# Patient Record
Sex: Female | Born: 1955 | Race: White | Hispanic: No | Marital: Married | Smoking: Former smoker
Health system: Southern US, Community
[De-identification: ages and names within clinical notes are randomized; demographics above are authoritative.]

## PROBLEM LIST (undated history)

## (undated) DIAGNOSIS — B009 Herpesviral infection, unspecified: Secondary | ICD-10-CM

## (undated) DIAGNOSIS — T7840XA Allergy, unspecified, initial encounter: Secondary | ICD-10-CM

## (undated) DIAGNOSIS — M1990 Unspecified osteoarthritis, unspecified site: Secondary | ICD-10-CM

## (undated) DIAGNOSIS — C801 Malignant (primary) neoplasm, unspecified: Secondary | ICD-10-CM

## (undated) DIAGNOSIS — R7301 Impaired fasting glucose: Secondary | ICD-10-CM

## (undated) DIAGNOSIS — A048 Other specified bacterial intestinal infections: Secondary | ICD-10-CM

## (undated) DIAGNOSIS — E785 Hyperlipidemia, unspecified: Secondary | ICD-10-CM

## (undated) HISTORY — PX: CATARACT EXTRACTION, BILATERAL: SHX1313

## (undated) HISTORY — DX: Herpesviral infection, unspecified: B00.9

## (undated) HISTORY — DX: Allergy, unspecified, initial encounter: T78.40XA

## (undated) HISTORY — PX: TONSILLECTOMY: SUR1361

## (undated) HISTORY — PX: VAGINAL HYSTERECTOMY: SUR661

## (undated) HISTORY — DX: Unspecified osteoarthritis, unspecified site: M19.90

## (undated) HISTORY — PX: UPPER GASTROINTESTINAL ENDOSCOPY: SHX188

## (undated) HISTORY — DX: Other specified bacterial intestinal infections: A04.8

## (undated) HISTORY — DX: Impaired fasting glucose: R73.01

## (undated) HISTORY — PX: RETINAL DETACHMENT SURGERY: SHX105

## (undated) HISTORY — PX: WISDOM TOOTH EXTRACTION: SHX21

## (undated) HISTORY — DX: Hyperlipidemia, unspecified: E78.5

## (undated) HISTORY — DX: Malignant (primary) neoplasm, unspecified: C80.1

---

## 1979-09-01 DIAGNOSIS — N879 Dysplasia of cervix uteri, unspecified: Secondary | ICD-10-CM

## 1979-09-01 HISTORY — DX: Dysplasia of cervix uteri, unspecified: N87.9

## 2005-08-31 DIAGNOSIS — H269 Unspecified cataract: Secondary | ICD-10-CM

## 2005-08-31 HISTORY — DX: Unspecified cataract: H26.9

## 2006-05-06 ENCOUNTER — Ambulatory Visit (HOSPITAL_COMMUNITY): Admission: RE | Admit: 2006-05-06 | Discharge: 2006-05-07 | Payer: Self-pay | Admitting: Ophthalmology

## 2017-07-21 HISTORY — PX: COLONOSCOPY: SHX174

## 2017-08-31 DIAGNOSIS — H3320 Serous retinal detachment, unspecified eye: Secondary | ICD-10-CM

## 2017-08-31 HISTORY — DX: Serous retinal detachment, unspecified eye: H33.20

## 2019-01-30 DIAGNOSIS — Z5189 Encounter for other specified aftercare: Secondary | ICD-10-CM

## 2019-01-30 DIAGNOSIS — D649 Anemia, unspecified: Secondary | ICD-10-CM

## 2019-01-30 HISTORY — DX: Encounter for other specified aftercare: Z51.89

## 2019-01-30 HISTORY — DX: Anemia, unspecified: D64.9

## 2019-03-01 DIAGNOSIS — H409 Unspecified glaucoma: Secondary | ICD-10-CM

## 2019-03-01 HISTORY — DX: Unspecified glaucoma: H40.9

## 2019-03-14 ENCOUNTER — Encounter: Payer: Self-pay | Admitting: Gastroenterology

## 2019-03-17 ENCOUNTER — Telehealth (INDEPENDENT_AMBULATORY_CARE_PROVIDER_SITE_OTHER): Admitting: Gastroenterology

## 2019-03-17 ENCOUNTER — Other Ambulatory Visit: Payer: Self-pay

## 2019-03-17 ENCOUNTER — Encounter: Payer: Self-pay | Admitting: Gastroenterology

## 2019-03-17 VITALS — Ht 60.0 in | Wt 160.0 lb

## 2019-03-17 DIAGNOSIS — D509 Iron deficiency anemia, unspecified: Secondary | ICD-10-CM

## 2019-03-17 DIAGNOSIS — R195 Other fecal abnormalities: Secondary | ICD-10-CM

## 2019-03-17 MED ORDER — CLENPIQ 10-3.5-12 MG-GM -GM/160ML PO SOLN: 1.0000 | Freq: Once | ORAL | 0 refills | Status: AC

## 2019-03-17 NOTE — Progress Notes (Signed)
Chief Complaint: Severe anemia.  Referring Provider:  Charlynn Court, NP      ASSESSMENT AND PLAN;   #1. IDA with heme positive stools(Hb 4.7 s/p 2U to 8, 12/06/2018).  #2. Slow GI bleed.  Plan: -Records from Lucita Lora FNP [facility] Glassman to get) -CT Abdo/pelvis with p.o. and IV contrast. -EGD with SB bx and colonoscopy for further evaluation.  Have discussed risks and benefits including small but definite risk of perforation requiring laparotomy, bleeding, rarely missing neoplasms.  If negative, may need capsule endoscopy. -Trend CBC.  She had blood work drawn this morning. -D/w patient and patient's husband Mikki Santee   HPI:    Diane Figueroa is a 63 y.o. female  Was feeling weak tired and looking pale since Memorial Day per husband.  She will also get short of breath especially while biking.  No craving of ice.  She was craving tortillas. Did have palpitations.  Seen by Lucita Lora.  Found to have hemoglobin of 4.7.  Sent to the hospital for emergent blood transfusion.  Do not have all records.  She was also found to have heme positive stools.   No NSAIDs.  No nausea, vomiting, heartburn, regurgitation, odynophagia or dysphagia.  No significant diarrhea or constipation.  There is no melena or hematochezia. No unintentional weight loss.  No abdo pain.  No hematuria, nosebleeds, or any vaginal bleeding.  Of note that she is status post vaginal hysterectomy in the past.  Colon (07/2017 [facility])-6 mm polyp status post polypectomy, minimal sigmoid diverticulosis. Bx- TA Past Medical History:  Diagnosis Date  . H. pylori infection   . Herpes simplex   . Hyperlipidemia   . IFG (impaired fasting glucose)     Past Surgical History:  Procedure Laterality Date  . CATARACT EXTRACTION, BILATERAL    . COLONOSCOPY  07/21/2017   Colonic polyp status post polypectomy. Very minimal sigmoid diverticulosis.   Marland Kitchen VAGINAL HYSTERECTOMY    . WISDOM TOOTH EXTRACTION      Family History  Problem  Relation Age of Onset  . Lung cancer Father   . Lung cancer Paternal Grandfather   . Colon cancer Neg Hx     Social History   Tobacco Use  . Smoking status: Never Smoker  . Smokeless tobacco: Never Used  Substance Use Topics  . Alcohol use: Never    Frequency: Never  . Drug use: Not on file    Current Outpatient Medications  Medication Sig Dispense Refill  . atorvastatin (LIPITOR) 20 MG tablet daily.    . ferrous sulfate 325 (65 FE) MG EC tablet Take 325 mg by mouth 3 (three) times daily with meals.    . valACYclovir (VALTREX) 500 MG tablet Take 500 mg by mouth daily.     No current facility-administered medications for this visit.     Allergies  Allergen Reactions  . Advair Diskus [Fluticasone-Salmeterol]     Review of Systems:  Constitutional: Denies fever, chills, diaphoresis, appetite change and had extreme fatigue.  HEENT: Denies photophobia, eye pain, redness, hearing loss, ear pain, congestion, sore throat, rhinorrhea, sneezing, mouth sores, neck pain, neck stiffness and tinnitus.   Respiratory: Denies SOB, DOE, cough, chest tightness,  and wheezing.  Feels better now after transfusion. Cardiovascular: Denies chest pain, palpitations and leg swelling.  Genitourinary: Denies dysuria, urgency, frequency, hematuria, flank pain and difficulty urinating.  Musculoskeletal: Denies myalgias, back pain, joint swelling, arthralgias and gait problem.  Skin: No rash.  Neurological: Denies dizziness, seizures, syncope, weakness, light-headedness,  numbness and headaches.  Hematological: Denies adenopathy. Easy bruising, personal or family bleeding history  Psychiatric/Behavioral: No anxiety or depression     Physical Exam:    Ht 5' (1.524 m)   Wt 160 lb (72.6 kg)   BMI 31.25 kg/m  Filed Weights   03/17/19 1154  Weight: 160 lb (72.6 kg)  Tele-visit.   This service was provided via telemedicine.  The patient was located at home.  The provider was located in office.   The patient did consent to this telephone visit and is aware of possible charges through their insurance for this visit.  The patient was referred by Lucita Lora, FNP.  The other persons participating in this telemedicine service were patient's husband Mikki Santee and their role was coordination of care.  Time spent on call/coordination of care: 30 min  Carmell Austria, MD 03/17/2019, 2:06 PM  Cc: Charlynn Court, NP

## 2019-03-17 NOTE — Patient Instructions (Signed)
If you are age 64 or older, your body mass index should be between 23-30. Your Body mass index is 31.25 kg/m. If this is out of the aforementioned range listed, please consider follow up with your Primary Care Provider.  If you are age 3 or younger, your body mass index should be between 19-25. Your Body mass index is 31.25 kg/m. If this is out of the aformentioned range listed, please consider follow up with your Primary Care Provider.   You have been scheduled for an endoscopy and colonoscopy. Please follow the written instructions given to you at your visit today. Please pick up your prep supplies at the pharmacy within the next 1-3 days. If you use inhalers (even only as needed), please bring them with you on the day of your procedure. Your physician has requested that you go to www.startemmi.com and enter the access code given to you at your visit today. This web site gives a general overview about your procedure. However, you should still follow specific instructions given to you by our office regarding your preparation for the procedure.  I have attached a Pre Procedure Patient Acknowledgement form and a prepaid envelope, please initial and sign form and mail back in envelope.   You have been scheduled [facility] CT [facility] [place] [address] [address]).   You are scheduled on 03/23/19 at [place] should arrive 15 minutes prior to your appointment time for registration. Please follow the written instructions below on the day of your exam:  WARNING: IF YOU ARE ALLERGIC TO IODINE/X-RAY DYE, PLEASE NOTIFY RADIOLOGY IMMEDIATELY AT [phone]! YOU WILL BE GIVEN A 13 HOUR PREMEDICATION PREP.  1) Do not eat or drink anything after 5am (4 hours prior to your test) 2) You have been given 2 bottles of oral contrast to drink. The solution may taste better if refrigerated, but do NOT add ice or any other liquid to this  solution. Shake well before drinking.    Drink 1 bottle of contrast @ 7am (2 hours prior to your exam)  Drink 1 bottle of contrast @ 8am (1 hour prior to your exam)  You may take any medications as prescribed with a small amount of water, if necessary. If you take any of the following medications: METFORMIN, GLUCOPHAGE, GLUCOVANCE, AVANDAMET, RIOMET, FORTAMET, [place] MET, JANUMET, GLUMETZA or METAGLIP, you MAY be asked to HOLD this medication 48 hours AFTER the exam.  The purpose of you drinking the oral contrast is to aid in the visualization of your intestinal tract. The contrast solution may cause some diarrhea. Depending on your individual set of symptoms, you may also receive an intravenous injection of x-ray contrast/dye. Plan on being at [facility] for 30 minutes or longer, depending on the type of exam you are having performed.  This test typically takes 30-45 minutes to complete.  If you have any questions regarding your exam or if you need to reschedule, you may call the CT department at [phone] between the hours of 8:00 am and 5:00 pm, Monday-Friday.  ________________________________________________________________________  Thank you,  Dr. Jackquline Denmark

## 2019-03-20 ENCOUNTER — Telehealth: Payer: Self-pay | Admitting: Gastroenterology

## 2019-03-20 DIAGNOSIS — K259 Gastric ulcer, unspecified as acute or chronic, without hemorrhage or perforation: Secondary | ICD-10-CM

## 2019-03-20 HISTORY — DX: Gastric ulcer, unspecified as acute or chronic, without hemorrhage or perforation: K25.9

## 2019-03-20 NOTE — Telephone Encounter (Signed)

## 2019-03-21 ENCOUNTER — Other Ambulatory Visit: Payer: Self-pay

## 2019-03-21 ENCOUNTER — Ambulatory Visit (AMBULATORY_SURGERY_CENTER): Admitting: Gastroenterology

## 2019-03-21 ENCOUNTER — Encounter: Payer: Self-pay | Admitting: Gastroenterology

## 2019-03-21 VITALS — BP 121/61 | HR 66 | Temp 98.0°F | Resp 24 | Ht 60.0 in | Wt 160.0 lb

## 2019-03-21 DIAGNOSIS — K2970 Gastritis, unspecified, without bleeding: Secondary | ICD-10-CM | POA: Diagnosis not present

## 2019-03-21 DIAGNOSIS — K5520 Angiodysplasia of colon without hemorrhage: Secondary | ICD-10-CM

## 2019-03-21 DIAGNOSIS — K2990 Gastroduodenitis, unspecified, without bleeding: Secondary | ICD-10-CM | POA: Diagnosis not present

## 2019-03-21 DIAGNOSIS — R195 Other fecal abnormalities: Secondary | ICD-10-CM

## 2019-03-21 DIAGNOSIS — K649 Unspecified hemorrhoids: Secondary | ICD-10-CM | POA: Diagnosis not present

## 2019-03-21 DIAGNOSIS — K259 Gastric ulcer, unspecified as acute or chronic, without hemorrhage or perforation: Secondary | ICD-10-CM

## 2019-03-21 DIAGNOSIS — D508 Other iron deficiency anemias: Secondary | ICD-10-CM

## 2019-03-21 DIAGNOSIS — K5730 Diverticulosis of large intestine without perforation or abscess without bleeding: Secondary | ICD-10-CM | POA: Diagnosis not present

## 2019-03-21 DIAGNOSIS — K3189 Other diseases of stomach and duodenum: Secondary | ICD-10-CM

## 2019-03-21 MED ORDER — SODIUM CHLORIDE 0.9 % IV SOLN: 500.0000 mL | Freq: Once | INTRAVENOUS | Status: DC

## 2019-03-21 MED ORDER — PANTOPRAZOLE SODIUM 40 MG PO TBEC: 40.0000 mg | DELAYED_RELEASE_TABLET | Freq: Every day | ORAL | 6 refills | Status: AC

## 2019-03-21 NOTE — Op Note (Signed)
[place] Patient Name: Diane Figueroa Name Procedure Date: 03/21/2019 1:24 PM MRN: [id number] Endoscopist: Jackquline Denmark , MD Age: 63 Referring MD:  Date of Birth: March 04, 1956 Gender: Female Account #: [id number] Procedure:                Colonoscopy Indications:              IDA with heme positive stools(Hb 4.7 s/p 2U to 8,                            12/06/2018). Medicines:                Monitored Anesthesia Care Procedure:                Pre-Anesthesia Assessment:                           - Prior to the procedure, a History and Physical                            was performed, and patient medications and                            allergies were reviewed. The patient's tolerance of                            previous anesthesia was also reviewed. The risks                            and benefits of the procedure and the sedation                            options and risks were discussed with the patient.                            All questions were answered, and informed consent                            was obtained. Prior Anticoagulants: The patient has                            taken no previous anticoagulant or antiplatelet                            agents. ASA Grade Assessment: II - A patient with                            mild systemic disease. After reviewing the risks                            and benefits, the patient was deemed in                            satisfactory condition to undergo the procedure.  After obtaining informed consent, the colonoscope                            was passed under direct vision. Throughout the                            procedure, the patient's blood pressure, pulse, and                            oxygen saturations were monitored continuously. The                            Colonoscope was introduced through the anus and                            advanced to the 4 cm into the ileum. The                     colonoscopy was performed without difficulty. The                            patient tolerated the procedure well. The quality                            of the bowel preparation was excellent. The                            terminal ileum, ileocecal valve, appendiceal                            orifice, and rectum were photographed. Scope In: 1:46:25 PM Scope Out: 2:05:40 PM Scope Withdrawal Time: 0 hours 12 minutes 36 seconds  Total Procedure Duration: 0 hours 19 minutes 15 seconds  Findings:                 Two angiodysplastic lesions (18mm and 1 cm) without                            bleeding were found in the ascending colon and in                            the cecum. Estimated blood loss: none.                           A few rare small-mouthed diverticula were found in                            the sigmoid colon.                           Non-bleeding internal hemorrhoids were found during                            retroflexion. The hemorrhoids were moderate.  The terminal ileum appeared normal.                           The exam was otherwise without abnormality. Complications:            No immediate complications. Estimated Blood Loss:     Estimated blood loss: none. Impression:               - Incidental 2 non-bleeding colonic AVMs.                           - Minimal sigmoid diverticulosis.                           - Non-bleeding internal hemorrhoids.                           - Otherwise normal colonoscopy to TI. Recommendation:           - Patient has a contact number available for                            emergencies. The signs and symptoms of potential                            delayed complications were discussed with the                            patient. Return to normal activities tomorrow.                            Written discharge instructions were provided to the                            patient.                            - Resume previous diet.                           - Continue present medications.                           - No ibuprofen, naproxen, or other non-steroidal                            anti-inflammatory drugs.                           - Return to GI clinic in 4 weeks.                           - She is scheduled to have CT Abdo/pelvis this week.                           - Please see EGD note for further plan.                           -  In the future, if she continues to have anemia                            with heme positive stools and the work-up is                            negative, would consider APC treatment (at [facility]) of                            above AVMs. Jackquline Denmark, MD 03/21/2019 2:26:29 PM This report has been signed electronically.

## 2019-03-21 NOTE — Progress Notes (Signed)
Pt's states no medical or surgical changes since previsit or office visit. 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

## 2019-03-21 NOTE — Progress Notes (Signed)
PT taken to PACU. Monitors in place. VSS. Report given to RN. 

## 2019-03-21 NOTE — Progress Notes (Signed)
Called to room to assist during endoscopic procedure.  Patient ID and intended procedure confirmed with present staff. Received instructions for my participation in the procedure from the performing physician.  

## 2019-03-21 NOTE — Patient Instructions (Signed)
Please read handouts provided. Continue present medications. Return to GI clinic in 4 weeks. No ibuprofen, naproxen, or other non-steriodal anti-inflammatory drugs. Begin Protonix 40 mg once a day. Await pathology results. CBC in 2 weeks. Repeat EGD in 12 weeks.      YOU HAD AN ENDOSCOPIC PROCEDURE TODAY AT [facility]:   Refer to the procedure report that was given to you for any specific questions about what was found during the examination.  If the procedure report does not answer your questions, please call your gastroenterologist to clarify.  If you requested that your care partner not be given the details of your procedure findings, then the procedure report has been included in a sealed envelope for you to review at your convenience later.  YOU SHOULD EXPECT: Some feelings of bloating in the abdomen. Passage of more gas than usual.  Walking can help get rid of the air that was put into your GI tract during the procedure and reduce the bloating. If you had a lower endoscopy (such as a colonoscopy or flexible sigmoidoscopy) you may notice spotting of blood in your stool or on the toilet paper. If you underwent a bowel prep for your procedure, you may not have a normal bowel movement for a few days.  Please Note:  You might notice some irritation and congestion in your nose or some drainage.  This is from the oxygen used during your procedure.  There is no need for concern and it should clear up in a day or so.  SYMPTOMS TO REPORT IMMEDIATELY:   Following lower endoscopy (colonoscopy or flexible sigmoidoscopy):  Excessive amounts of blood in the stool  Significant tenderness or worsening of abdominal pains  Swelling of the abdomen that is new, acute  Fever of 100F or higher   Following upper endoscopy (EGD)  Vomiting of blood or coffee ground material  New chest pain or pain under the shoulder blades  Painful or persistently difficult swallowing  New shortness  of breath  Fever of 100F or higher  Black, tarry-looking stools  For urgent or emergent issues, a gastroenterologist can be reached at any hour by calling [phone].   DIET:  We do recommend a small meal at first, but then you may proceed to your regular diet.  Drink plenty of fluids but you should avoid alcoholic beverages for 24 hours.  ACTIVITY:  You should plan to take it easy for the rest of today and you should NOT DRIVE or use heavy machinery until tomorrow (because of the sedation medicines used during the test).    FOLLOW UP: Our staff will call the number listed on your records 48-72 hours following your procedure to check on you and address any questions or concerns that you may have regarding the information given to you following your procedure. If we do not reach you, we will leave a message.  We will attempt to reach you two times.  During this call, we will ask if you have developed any symptoms of COVID 19. If you develop any symptoms (ie: fever, flu-like symptoms, shortness of breath, cough etc.) before then, please call [phone].  If you test positive for Covid 19 in the 2 weeks post procedure, please call and report this information to [place].    If any biopsies were taken you will be contacted by phone or by letter within the next 1-3 weeks.  Please call us at [phone] if you have not heard about the biopsies  in 3 weeks.    SIGNATURES/CONFIDENTIALITY: You and/or your care partner have signed paperwork which will be entered into your electronic medical record.  These signatures attest to the fact that that the information above on your After Visit Summary has been reviewed and is understood.  Full responsibility of the confidentiality of this discharge information lies with you and/or your care-partner.

## 2019-03-21 NOTE — Op Note (Signed)
[place] Patient Name: Lyndon Chenoweth Procedure Date: 03/21/2019 1:25 PM MRN: [id number] Endoscopist: Jackquline Denmark , MD Age: 63 Referring MD:  Date of Birth: 1956/08/17 Gender: Female Account #: [id number] Procedure:                Upper GI endoscopy Indications:              IDA with heme positive stools(Hb 4.7 s/p 2U to 8,                            12/06/2018). Medicines:                Monitored Anesthesia Care Procedure:                Pre-Anesthesia Assessment:                           - Prior to the procedure, a History and Physical                            was performed, and patient medications and                            allergies were reviewed. The patient's tolerance of                            previous anesthesia was also reviewed. The risks                            and benefits of the procedure and the sedation                            options and risks were discussed with the patient.                            All questions were answered, and informed consent                            was obtained. Prior Anticoagulants: The patient has                            taken no previous anticoagulant or antiplatelet                            agents. ASA Grade Assessment: II - A patient with                            mild systemic disease. After reviewing the risks                            and benefits, the patient was deemed in                            satisfactory condition to undergo the procedure.  After obtaining informed consent, the endoscope was                            passed under direct vision. Throughout the                            procedure, the patient's blood pressure, pulse, and                            oxygen saturations were monitored continuously. The                            Endoscope was introduced through the mouth, and                            advanced to the second part of duodenum. The  upper                            GI endoscopy was accomplished without difficulty.                            The patient tolerated the procedure well. Scope In: Scope Out: Findings:                 The examined esophagus was normal.                           The Z-line was irregular and was found 35 cm from                            the incisors. Biopsies were taken with a cold                            forceps for histology. Estimated blood loss: none.                           3 non-bleeding cratered gastric ulcers measuring 8                            mm, 8 mm and 1 cm with no stigmata of bleeding were                            found in the gastric body along the greater                            curvature (2) and in the antrum (1). Multiple                            biopsies were taken with a cold forceps for                            histology. Estimated blood loss: none. Mild  erythema in the antrum and in the first portion of                            the duodenum.                           The examined duodenum was normal. Biopsies for                            histology were taken with a cold forceps for                            evaluation of celiac disease. Complications:            No immediate complications. Estimated Blood Loss:     Estimated blood loss: none. Impression:               -Slightly irregular Z line (biopsied)                           -Gastric ulcers (biopsied)                           -Mild gastroduodenitis. Recommendation:           - Patient has a contact number available for                            emergencies. The signs and symptoms of potential                            delayed complications were discussed with the                            patient. Return to normal activities tomorrow.                            Written discharge instructions were provided to the                            patient.                            - Resume previous diet.                           -Start Protonix 40 mg p.o. once a day. # 30 with 6                            refills                           - Await pathology results.                           - No aspirin, ibuprofen, naproxen, or other  non-steroidal anti-inflammatory drugs.                           - Recheck CBC in 2 weeks (pl arrange)                           - Return to GI clinic in 4 weeks.                           - Repeat EGD in 12 weeks to ensure healing. Jackquline Denmark, MD 03/21/2019 2:16:33 PM This report has been signed electronically.

## 2019-03-22 ENCOUNTER — Other Ambulatory Visit: Payer: Self-pay

## 2019-03-22 DIAGNOSIS — R197 Diarrhea, unspecified: Secondary | ICD-10-CM

## 2019-03-22 DIAGNOSIS — D509 Iron deficiency anemia, unspecified: Secondary | ICD-10-CM

## 2019-03-23 ENCOUNTER — Encounter (HOSPITAL_BASED_OUTPATIENT_CLINIC_OR_DEPARTMENT_OTHER): Payer: Self-pay

## 2019-03-23 ENCOUNTER — Ambulatory Visit (HOSPITAL_BASED_OUTPATIENT_CLINIC_OR_DEPARTMENT_OTHER)
Admission: RE | Admit: 2019-03-23 | Discharge: 2019-03-23 | Disposition: A | Discharge status: Home / Self Care | Source: Ambulatory Visit | Attending: Gastroenterology | Admitting: Gastroenterology

## 2019-03-23 ENCOUNTER — Telehealth: Payer: Self-pay

## 2019-03-23 ENCOUNTER — Other Ambulatory Visit: Payer: Self-pay

## 2019-03-23 DIAGNOSIS — R195 Other fecal abnormalities: Secondary | ICD-10-CM | POA: Diagnosis present

## 2019-03-23 DIAGNOSIS — D509 Iron deficiency anemia, unspecified: Secondary | ICD-10-CM | POA: Insufficient documentation

## 2019-03-23 MED ORDER — IOHEXOL 300 MG/ML  SOLN
100.0000 mL | Freq: Once | INTRAMUSCULAR | Status: AC | PRN
  Administered 2019-03-23: 100 mL via INTRAVENOUS

## 2019-03-23 NOTE — Telephone Encounter (Signed)
  Follow up Call-  Call back number 03/21/2019  Post procedure Call Back phone  # [phone]  Permission to leave phone message Yes  Some recent data might be hidden     Patient questions:  Do you have a fever, pain , or abdominal swelling? No. Pain Score  0 *  Have you tolerated food without any problems? Yes.    Have you been able to return to your normal activities? Yes.    Do you have any questions about your discharge instructions: Diet   No. Medications  No. Follow up visit  No.  Do you have questions or concerns about your Care? No.  Actions: * If pain score is 4 or above: No action needed, pain <4.  1. Have you developed a fever since your procedure? no  2.   Have you had an respiratory symptoms (SOB or cough) since your procedure? no  3.   Have you tested positive for COVID 19 since your procedure no  4.   Have you had any family members/close contacts diagnosed with the COVID 19 since your procedure?  no   If yes to any of these questions please route to Joylene John, RN and Alphonsa Gin, Therapist, sports.

## 2019-03-25 ENCOUNTER — Encounter: Payer: Self-pay | Admitting: Gastroenterology

## 2019-04-20 ENCOUNTER — Telehealth (INDEPENDENT_AMBULATORY_CARE_PROVIDER_SITE_OTHER): Admitting: Gastroenterology

## 2019-04-20 ENCOUNTER — Other Ambulatory Visit: Payer: Self-pay

## 2019-04-20 DIAGNOSIS — D509 Iron deficiency anemia, unspecified: Secondary | ICD-10-CM

## 2019-04-20 NOTE — Progress Notes (Signed)
Chief Complaint:FU  Referring Provider:  Charlynn Court, NP      ASSESSMENT AND PLAN;   #1. IDA  (resolved) (Hb 4.7 s/p 2U to 8, 12/06/2018, Hb 04/04/2019 12.8, MCV 93).  #2.  Likely UGI bleed d/t GU. Neg HP, neg SB Bx for celiac.  Neg CT AP 03/2019 and colon 03/2019 except for nonbleeding AVMs and diverticulosis.  #3. GERD  Plan: -Repeat EGD in 12 weeks (October end or November 2020) to ensure healing of the ulcers. -Recheck CBC September end. -Continue iron supplements for now. -Continue Protonix 40 mg p.o. once a day for now. -D/w patient and patient's husband Mikki Santee   HPI:    Diane Figueroa is a 63 y.o. female  For follow-up visit. Doing much better. Had EGD and colonoscopy 03/20/2019 which showed gastric ulcers.  Biopsies were negative for H. pylori.  Colonoscopy showed nonbleeding AVMs and colonic diverticulosis.  CT was negative for any acute abnormalities or any bleeding.  She feels much better.  Repeat hemoglobin on 04/04/2019 was 12.8, MCV 93.  Tolerating iron well.  Had some constipation.  Drinks plenty of water.  No hematuria, nosebleeds, or any vaginal bleeding.  Of note that she is status post vaginal hysterectomy in the past.  Past Medical History:  Diagnosis Date  . H. pylori infection   . Herpes simplex   . Hyperlipidemia   . IFG (impaired fasting glucose)     Past Surgical History:  Procedure Laterality Date  . CATARACT EXTRACTION, BILATERAL    . COLONOSCOPY  07/21/2017   Colonic polyp status post polypectomy. Very minimal sigmoid diverticulosis.   Marland Kitchen VAGINAL HYSTERECTOMY    . WISDOM TOOTH EXTRACTION      Family History  Problem Relation Age of Onset  . Lung cancer Father   . Lung cancer Paternal Grandfather   . Colon cancer Neg Hx     Social History   Tobacco Use  . Smoking status: Former Research scientist (life sciences)  . Smokeless tobacco: Never Used  . Tobacco comment: quit smoking in 2007  Substance Use Topics  . Alcohol use: Not Currently    Frequency:  Never  . Drug use: Never    Current Outpatient Medications  Medication Sig Dispense Refill  . atorvastatin (LIPITOR) 20 MG tablet daily.    . brimonidine (ALPHAGAN) 0.15 % ophthalmic solution INSTILL 1 DROP INTO LEFT EYE TWICE DAILY    . dorzolamide-timolol (COSOPT) 22.3-6.8 MG/ML ophthalmic solution INSTILL 1 DROP INTO LEFT EYE TWICE DAILY    . ferrous sulfate 325 (65 FE) MG EC tablet Take 325 mg by mouth 2 (two) times daily.     Marland Kitchen latanoprost (XALATAN) 0.005 % ophthalmic solution INSTILL 1 DROP INTO LEFT EYE IN THE EVENING    . pantoprazole (PROTONIX) 40 MG tablet Take 1 tablet (40 mg total) by mouth daily. 30 tablet 6  . valACYclovir (VALTREX) 500 MG tablet Take 500 mg by mouth daily.     No current facility-administered medications for this visit.     Allergies  Allergen Reactions  . Advair Diskus [Fluticasone-Salmeterol]     Review of Systems:  neg     Physical Exam:    There were no vitals taken for this visit. There were no vitals filed for this visit.Tele-visit.   This service was provided via telemedicine.  Video visit failed.  The patient was located at home.  The provider was located in office.  The patient did consent to this telephone visit and is aware of  possible charges through their insurance for this visit.  The patient was referred by Lucita Lora, FNP.  The other persons participating in this telemedicine service were patient's husband Mikki Santee and their role was coordination of care.  Time spent on call/coordination of care: 15 min  Carmell Austria, MD 04/20/2019, 11:12 AM  Cc: Charlynn Court, NP

## 2019-04-20 NOTE — Patient Instructions (Signed)
If you are age 63 or older, your body mass index should be between 23-30. Your There is no height or weight on file to calculate BMI. If this is out of the aforementioned range listed, please consider follow up with your Primary Care Provider.  If you are age 67 or younger, your body mass index should be between 19-25. Your There is no height or weight on file to calculate BMI. If this is out of the aformentioned range listed, please consider follow up with your Primary Care Provider.   You will be due for a recall EGD in 07/2019. We will send you a reminder in the mail when it gets closer to that time.  Please go to the lab at [facility] ([place] You will need to go to level "B", you do not need an appointment for this. Hours available are 7:30 am - 4:30 pm. Dr. Lyndel Safe would like for you to do this at the end of September.   Continue iron and protonix.   Thank you,  Dr. Jackquline Denmark

## 2019-06-05 ENCOUNTER — Other Ambulatory Visit (INDEPENDENT_AMBULATORY_CARE_PROVIDER_SITE_OTHER)

## 2019-06-05 DIAGNOSIS — D509 Iron deficiency anemia, unspecified: Secondary | ICD-10-CM

## 2019-06-05 LAB — CBC WITH DIFFERENTIAL/PLATELET
Basophils Absolute: 0.1 10*3/uL (ref 0.0–0.1)
Basophils Relative: 1 % (ref 0.0–3.0)
Eosinophils Absolute: 0.2 10*3/uL (ref 0.0–0.7)
Eosinophils Relative: 3 % (ref 0.0–5.0)
HCT: 44.6 % (ref 36.0–46.0)
Hemoglobin: 14.7 g/dL (ref 12.0–15.0)
Lymphocytes Relative: 30 % (ref 12.0–46.0)
Lymphs Abs: 1.9 10*3/uL (ref 0.7–4.0)
MCHC: 32.9 g/dL (ref 30.0–36.0)
MCV: 94.9 fl (ref 78.0–100.0)
Monocytes Absolute: 0.6 10*3/uL (ref 0.1–1.0)
Monocytes Relative: 10.3 % (ref 3.0–12.0)
Neutro Abs: 3.5 10*3/uL (ref 1.4–7.7)
Neutrophils Relative %: 55.7 % (ref 43.0–77.0)
Platelets: 208 10*3/uL (ref 150.0–400.0)
RBC: 4.7 Mil/uL (ref 3.87–5.11)
RDW: 12.5 % (ref 11.5–15.5)
WBC: 6.3 10*3/uL (ref 4.0–10.5)

## 2019-08-11 ENCOUNTER — Encounter: Payer: Self-pay | Admitting: Gastroenterology

## 2019-08-14 DIAGNOSIS — U071 COVID-19: Secondary | ICD-10-CM

## 2019-08-14 HISTORY — DX: COVID-19: U07.1

## 2019-08-22 ENCOUNTER — Encounter: Payer: Self-pay | Admitting: Gastroenterology

## 2019-09-11 ENCOUNTER — Ambulatory Visit (AMBULATORY_SURGERY_CENTER): Payer: Self-pay | Admitting: *Deleted

## 2019-09-11 ENCOUNTER — Other Ambulatory Visit: Payer: Self-pay

## 2019-09-11 VITALS — Temp 96.9°F | Ht 60.0 in | Wt 162.0 lb

## 2019-09-11 DIAGNOSIS — K259 Gastric ulcer, unspecified as acute or chronic, without hemorrhage or perforation: Secondary | ICD-10-CM

## 2019-09-11 NOTE — Progress Notes (Signed)
No egg or soy allergy known to patient  No issues with past sedation with any surgeries  or procedures, no intubation problems  No diet pills per patient No home 02 use per patient  No blood thinners per patient  Pt denies issues with constipation  No A fib or A flutter  EMMI video sent to pt's e mail   08-14-2019 pt had COVID 19- she currently has no s/s- no fever- she will not require pre screen testing prior to EGD 09-26-2019  Due to the COVID-19 pandemic we are asking patients to follow these guidelines. Please only bring one care partner. Please be aware that your care partner may wait in the car in the parking lot or if they feel like they will be too hot to wait in the car, they may wait in the lobby on the 4th floor. All care partners are required to wear a mask the entire time (we do not have any that we can provide them), they need to practice social distancing, and we will do a Covid check for all patient's and care partners when you arrive. Also we will check their temperature and your temperature. If the care partner waits in their car they need to stay in the parking lot the entire time and we will call them on their cell phone when the patient is ready for discharge so they can bring the car to the front of the building. Also all patient's will need to wear a mask into building.

## 2019-09-26 ENCOUNTER — Other Ambulatory Visit: Payer: Self-pay

## 2019-09-26 ENCOUNTER — Ambulatory Visit (AMBULATORY_SURGERY_CENTER): Admitting: Gastroenterology

## 2019-09-26 ENCOUNTER — Encounter: Payer: Self-pay | Admitting: Gastroenterology

## 2019-09-26 VITALS — BP 114/60 | HR 58 | Temp 95.9°F | Resp 14 | Ht 60.0 in | Wt 162.0 lb

## 2019-09-26 DIAGNOSIS — K2950 Unspecified chronic gastritis without bleeding: Secondary | ICD-10-CM

## 2019-09-26 DIAGNOSIS — K259 Gastric ulcer, unspecified as acute or chronic, without hemorrhage or perforation: Secondary | ICD-10-CM | POA: Diagnosis present

## 2019-09-26 MED ORDER — SODIUM CHLORIDE 0.9 % IV SOLN: 500.0000 mL | Freq: Once | INTRAVENOUS | Status: AC

## 2019-09-26 NOTE — Progress Notes (Signed)
Report to PACU, RN, vss, BBS= Clear.  

## 2019-09-26 NOTE — Patient Instructions (Signed)
YOU HAD AN ENDOSCOPIC PROCEDURE TODAY AT [facility]:   Refer to the procedure report that was given to you for any specific questions about what was found during the examination.  If the procedure report does not answer your questions, please call your gastroenterologist to clarify.  If you requested that your care partner not be given the details of your procedure findings, then the procedure report has been included in a sealed envelope for you to review at your convenience later.  YOU SHOULD EXPECT: Some feelings of bloating in the abdomen. Passage of more gas than usual.  Walking can help get rid of the air that was put into your GI tract during the procedure and reduce the bloating. If you had a lower endoscopy (such as a colonoscopy or flexible sigmoidoscopy) you may notice spotting of blood in your stool or on the toilet paper. If you underwent a bowel prep for your procedure, you may not have a normal bowel movement for a few days.  Please Note:  You might notice some irritation and congestion in your nose or some drainage.  This is from the oxygen used during your procedure.  There is no need for concern and it should clear up in a day or so.  SYMPTOMS TO REPORT IMMEDIATELY:    Following upper endoscopy (EGD)  Vomiting of blood or coffee ground material  New chest pain or pain under the shoulder blades  Painful or persistently difficult swallowing  New shortness of breath  Fever of 100F or higher  Black, tarry-looking stools  For urgent or emergent issues, a gastroenterologist can be reached at any hour by calling [phone].   DIET:  We do recommend a small meal at first, but then you may proceed to your regular diet.  Drink plenty of fluids but you should avoid alcoholic beverages for 24 hours.  MEDICATIONS: Continue present medications. Avoid Ibuprofen, Naproxen, or other non-steroidal anti-inflammatory drugs.  Follow Up: Return to see Dr. Lyndel Safe in his  office as needed.  Repeat CBC revealed normal hemoglobin.  Please see handouts given to you by your recovery nurse.  ACTIVITY:  You should plan to take it easy for the rest of today and you should NOT DRIVE or use heavy machinery until tomorrow (because of the sedation medicines used during the test).    FOLLOW UP: Our staff will call the number listed on your records 48-72 hours following your procedure to check on you and address any questions or concerns that you may have regarding the information given to you following your procedure. If we do not reach you, we will leave a message.  We will attempt to reach you two times.  During this call, we will ask if you have developed any symptoms of COVID 19. If you develop any symptoms (ie: fever, flu-like symptoms, shortness of breath, cough etc.) before then, please call [phone].  If you test positive for Covid 19 in the 2 weeks post procedure, please call and report this information to [place].    If any biopsies were taken you will be contacted by phone or by letter within the next 1-3 weeks.  Please call us at [phone] if you have not heard about the biopsies in 3 weeks.   Thank you for allowing [place] to provide for your healthcare needs today.   SIGNATURES/CONFIDENTIALITY: You and/or your care partner have signed paperwork which will be entered into your electronic medical record.  These signatures attest to  the fact that that the information above on your After Visit Summary has been reviewed and is understood.  Full responsibility of the confidentiality of this discharge information lies with you and/or your care-partner.

## 2019-09-26 NOTE — Progress Notes (Signed)
Called to room to assist during endoscopic procedure.  Patient ID and intended procedure confirmed with present staff. Received instructions for my participation in the procedure from the performing physician.  

## 2019-09-26 NOTE — Op Note (Signed)
[facility] Patient Name: Luverne Hutzel Procedure Date: 09/26/2019 1:57 PM MRN: [id number] Endoscopist: Jackquline Denmark , MD Age: 64 Referring MD:  Date of Birth: Apr 06, 1956 Gender: Female Account #: [id number] Procedure:                Upper GI endoscopy Indications:              UGI bleeding due to gastric ulcers 03/2019. Repeat                            EGD is being performed to ensure healing of the                            ulcers. Medicines:                Monitored Anesthesia Care Procedure:                Pre-Anesthesia Assessment:                           - Prior to the procedure, a History and Physical                            was performed, and patient medications and                            allergies were reviewed. The patient's tolerance of                            previous anesthesia was also reviewed. The risks                            and benefits of the procedure and the sedation                            options and risks were discussed with the patient.                            All questions were answered, and informed consent                            was obtained. Prior Anticoagulants: The patient has                            taken no previous anticoagulant or antiplatelet                            agents. ASA Grade Assessment: II - A patient with                            mild systemic disease. After reviewing the risks                            and benefits, the patient was deemed in  satisfactory condition to undergo the procedure.                           After obtaining informed consent, the endoscope was                            passed under direct vision. Throughout the                            procedure, the patient's blood pressure, pulse, and                            oxygen saturations were monitored continuously. The                            Endoscope was introduced through the mouth, and                             advanced to the second part of duodenum. The upper                            GI endoscopy was accomplished without difficulty.                            The patient tolerated the procedure well. Scope In: Scope Out: Findings:                 The examined esophagus was normal with Z-line at 35                            cm from incisors..                           The ulcers had completely healed. There was minimal                            erythema in the body of the stomach likely at the                            site of previous ulcers. Biopsies were taken with a                            cold forceps for histology.                           The examined duodenum was normal. Complications:            No immediate complications. Estimated Blood Loss:     Estimated blood loss: none. Impression:               -Complete healing of gastric ulcers.                           -Minimal residual gastritis. Recommendation:           - Patient has a contact number available for  emergencies. The signs and symptoms of potential                            delayed complications were discussed with the                            patient. Return to normal activities tomorrow.                            Written discharge instructions were provided to the                            patient.                           - Resume previous diet.                           - Follow biopsies.                           - Continue present medications.                           - Avoid ibuprofen, naproxen, or other non-steroidal                            anti-inflammatory drugs.                           - Repeat CBC revealed normal hemoglobin.                           - Return to GI clinic PRN. Jackquline Denmark, MD 09/26/2019 2:17:22 PM This report has been signed electronically.

## 2019-09-26 NOTE — Progress Notes (Signed)
Temp JB VS DT  Pt's states no medical or surgical changes since previsit or office visit. 

## 2019-09-28 ENCOUNTER — Telehealth: Payer: Self-pay

## 2019-09-28 NOTE — Telephone Encounter (Signed)
  Follow up Call-  Call back number 09/26/2019 03/21/2019  Post procedure Call Back phone  # [phone]  Permission to leave phone message Yes Yes  Some recent data might be hidden     Patient questions:  Do you have a fever, pain , or abdominal swelling? No. Pain Score  0 *  Have you tolerated food without any problems? Yes.    Have you been able to return to your normal activities? Yes.    Do you have any questions about your discharge instructions: Diet   No. Medications  No. Follow up visit  No.  Do you have questions or concerns about your Care? No.  Actions: * If pain score is 4 or above: No action needed, pain <4.    1. Have you developed a fever since your procedure? No  2.   Have you had an respiratory symptoms (SOB or cough) since your procedure? No  3.   Have you tested positive for COVID 19 since your procedure No  4.   Have you had any family members/close contacts diagnosed with the COVID 19 since your procedure?  No   If yes to any of these questions please route to Joylene John, RN and Alphonsa Gin, RN.

## 2019-10-03 ENCOUNTER — Encounter: Payer: Self-pay | Admitting: Gastroenterology

## 2020-12-22 IMAGING — CT CT ABDOMEN AND PELVIS WITH CONTRAST
2 of 5 series · 17 of 46 positions shown, 19 images · IV contrast (omnipaque)
Comparison: None.

CLINICAL DATA: Iron deficiency anemia. Heme-positive stool.

EXAM:
CT ABDOMEN AND PELVIS WITH CONTRAST
TECHNIQUE: Multidetector CT imaging of the abdomen and pelvis was performed
using the standard protocol following bolus administration of
intravenous contrast.
CONTRAST:  100mL OMNIPAQUE IOHEXOL 300 MG/ML  SOLN

[Series 2: axial st · axial · 0.96mm/px · z∈[-476,-72]mm · 14 of 91 slices shown, 16 images]
[im 5/91  soft-tissue]
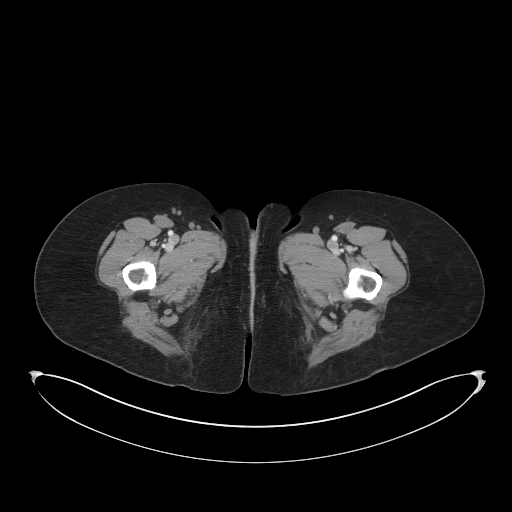
[im 5/91  bone]
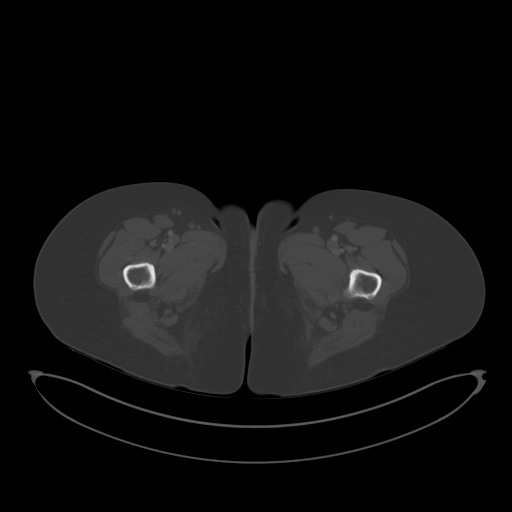
[im 10/91  soft-tissue]
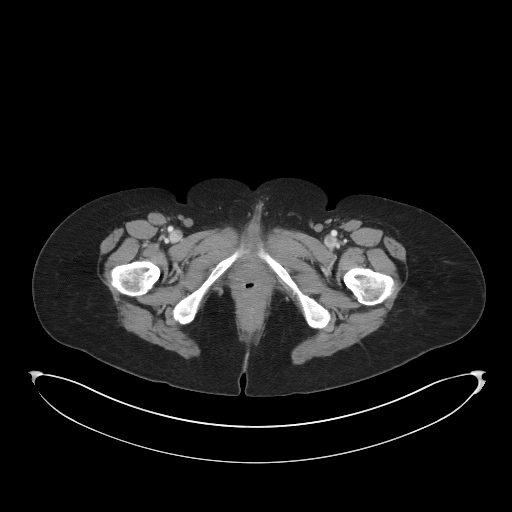
[im 19/91  soft-tissue]
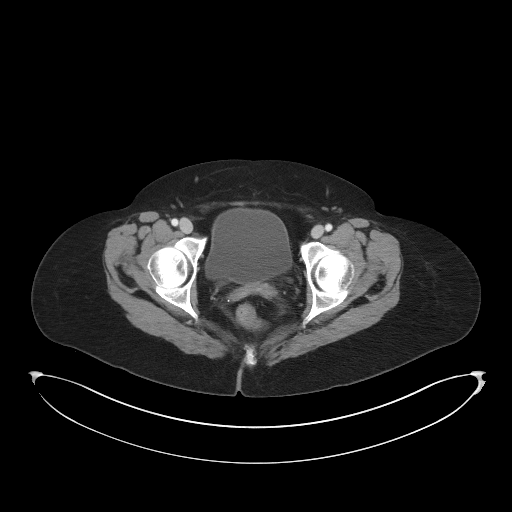
[im 24/91  soft-tissue]
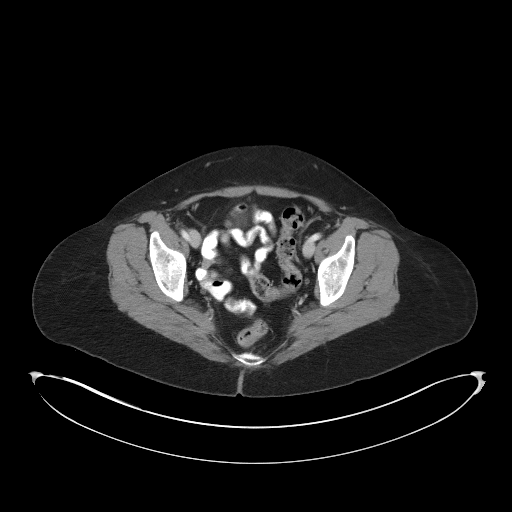
[im 29/91  soft-tissue]
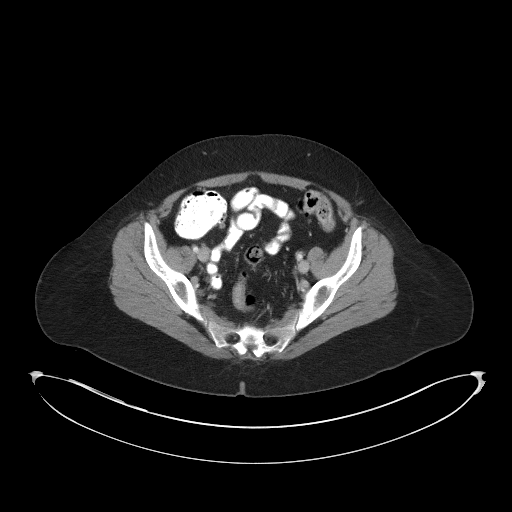
[im 38/91  soft-tissue]
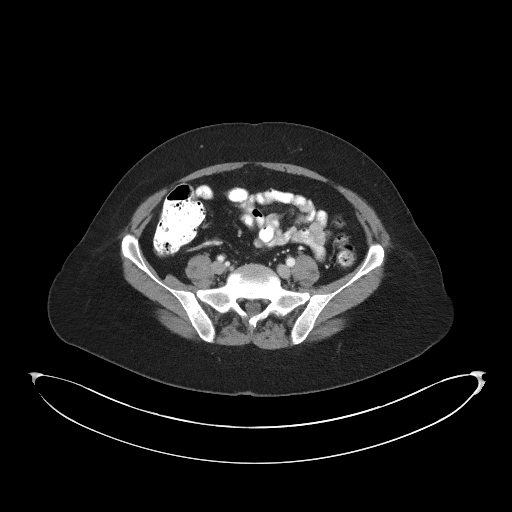
[im 43/91  soft-tissue]
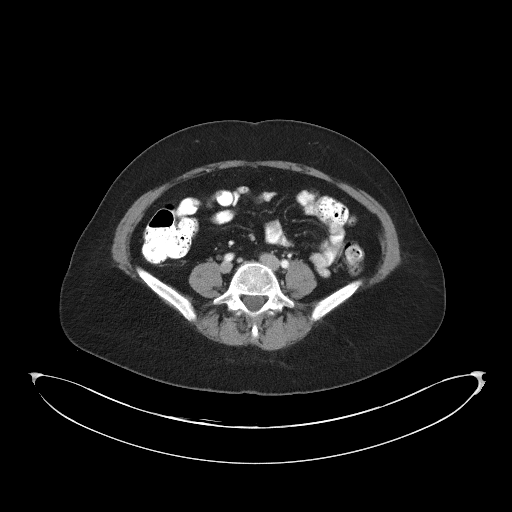
[im 48/91  soft-tissue]
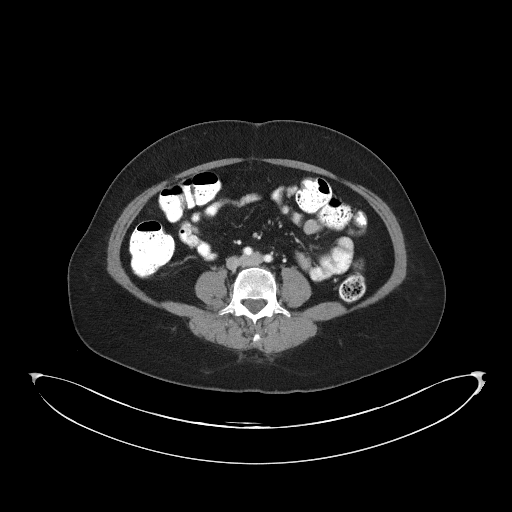
[im 53/91  soft-tissue]
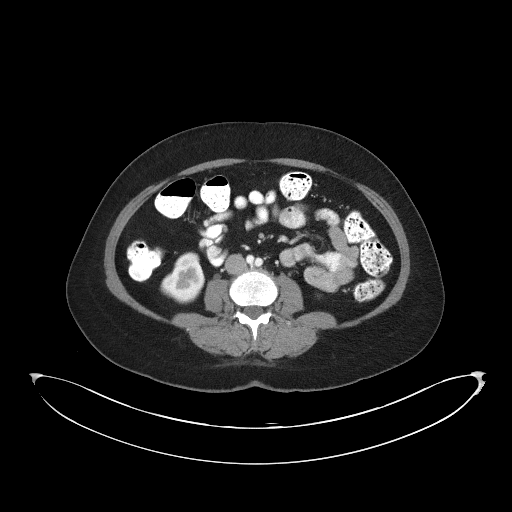
[im 53/91  bone]
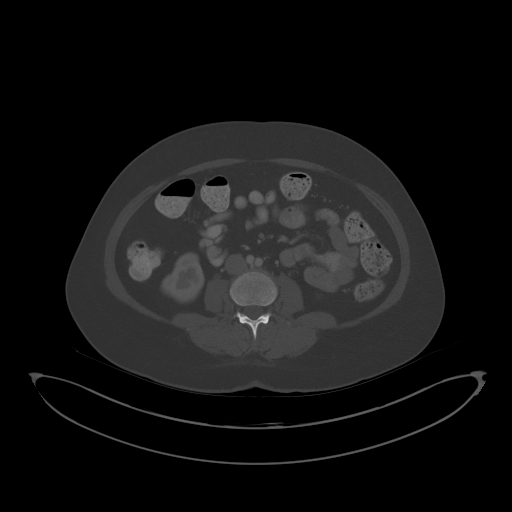
[im 62/91  soft-tissue]
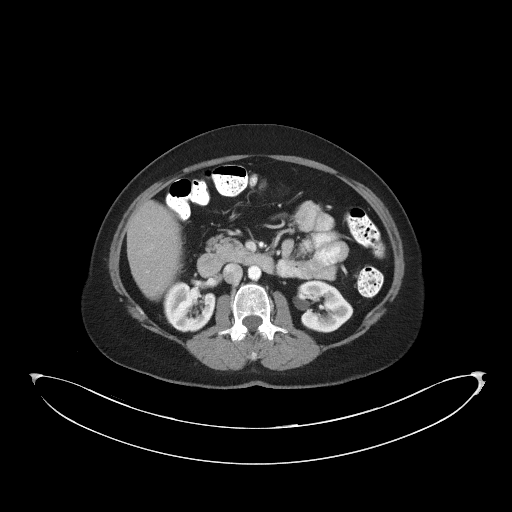
[im 67/91  soft-tissue]
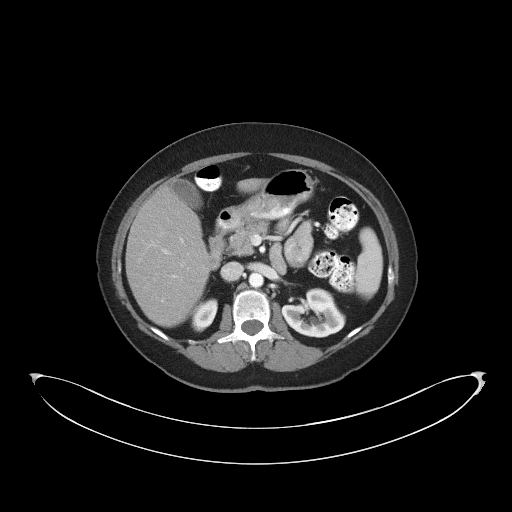
[im 72/91  soft-tissue]
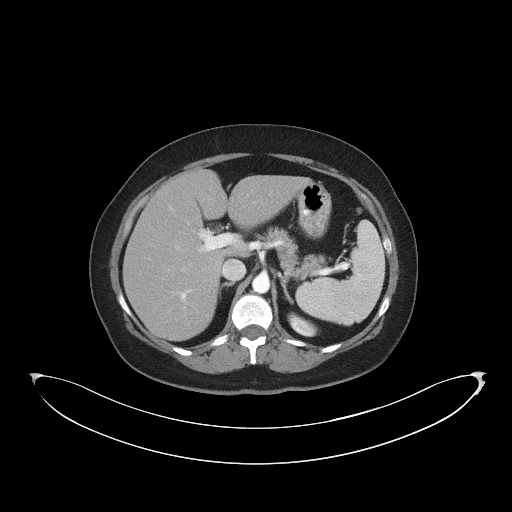
[im 81/91  soft-tissue]
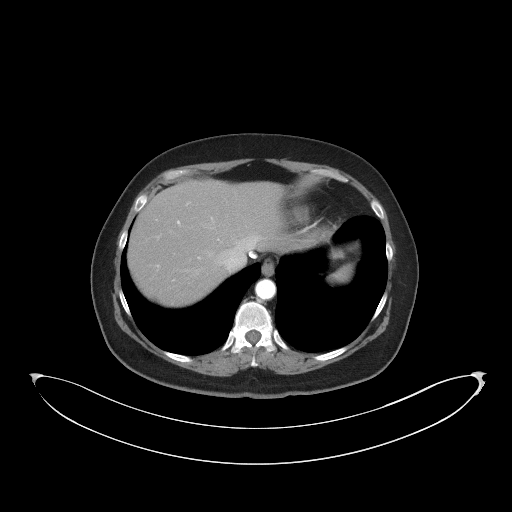
[im 86/91  soft-tissue]
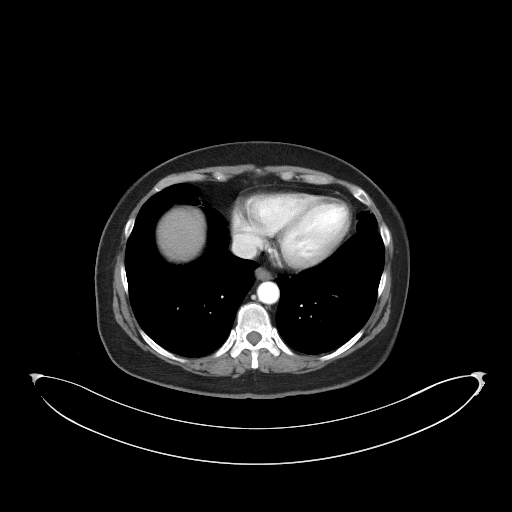

[Series 5: coronal st · coronal · 0.82mm/px · 3 of 90 slices shown]
[im 30/90  soft-tissue]
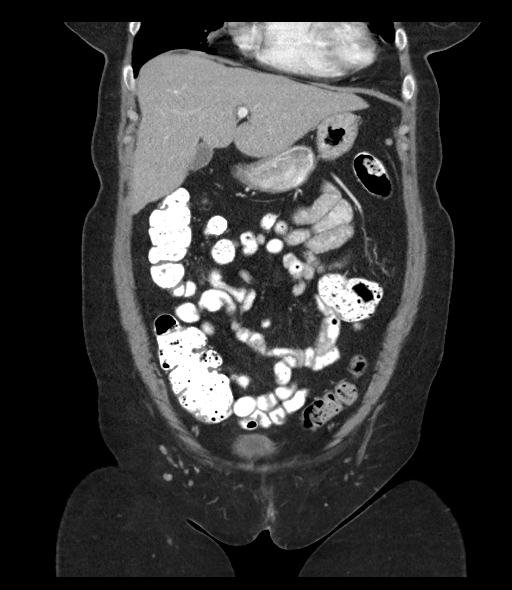
[im 40/90  soft-tissue]
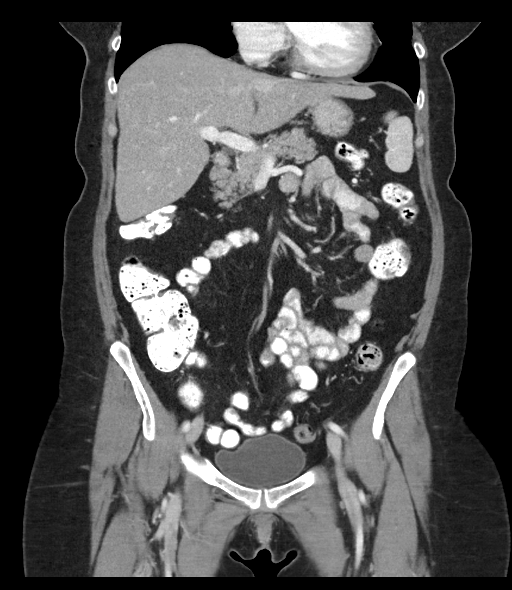
[im 50/90  soft-tissue]
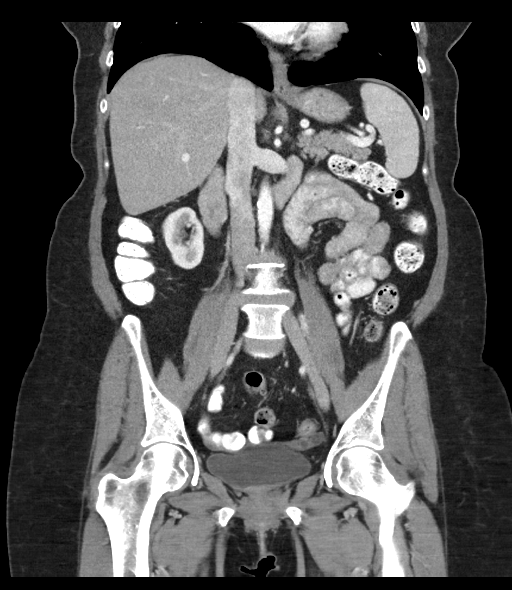

[17 of 46 positions shown; findings below may reference images not displayed]

FINDINGS: Lower Chest: No acute findings.

Hepatobiliary: No hepatic masses identified. Gallbladder is
unremarkable.

Pancreas:  No mass or inflammatory changes.

Spleen: Within normal limits in size and appearance.

Adrenals/Urinary Tract: Sub-cm low-attenuation lesion in left kidney
is too small to characterize but most likely represents a tiny cyst.
No definite masses identified. No evidence of ureteral calculi or
hydronephrosis. Unremarkable unopacified urinary bladder.

Stomach/Bowel: No evidence of obstruction, inflammatory process or
abnormal fluid collections.

Vascular/Lymphatic: No pathologically enlarged lymph nodes. No
abdominal aortic aneurysm.

Reproductive: Prior hysterectomy noted. Adnexal regions are
unremarkable in appearance.

Other:  None.

Musculoskeletal:  No suspicious bone lesions identified.
IMPRESSION: No acute findings or other significant abnormality.

## 2022-08-06 DIAGNOSIS — H35352 Cystoid macular degeneration, left eye: Secondary | ICD-10-CM | POA: Diagnosis not present

## 2022-08-06 DIAGNOSIS — H33002 Unspecified retinal detachment with retinal break, left eye: Secondary | ICD-10-CM | POA: Diagnosis not present

## 2022-08-06 DIAGNOSIS — H4052X1 Glaucoma secondary to other eye disorders, left eye, mild stage: Secondary | ICD-10-CM | POA: Diagnosis not present

## 2022-08-19 DIAGNOSIS — H33002 Unspecified retinal detachment with retinal break, left eye: Secondary | ICD-10-CM | POA: Diagnosis not present

## 2022-08-19 DIAGNOSIS — H44112 Panuveitis, left eye: Secondary | ICD-10-CM | POA: Diagnosis not present

## 2022-08-19 DIAGNOSIS — H209 Unspecified iridocyclitis: Secondary | ICD-10-CM | POA: Diagnosis not present

## 2022-08-19 DIAGNOSIS — H4052X1 Glaucoma secondary to other eye disorders, left eye, mild stage: Secondary | ICD-10-CM | POA: Diagnosis not present

## 2022-09-09 DIAGNOSIS — Z1329 Encounter for screening for other suspected endocrine disorder: Secondary | ICD-10-CM | POA: Diagnosis not present

## 2022-09-09 DIAGNOSIS — E785 Hyperlipidemia, unspecified: Secondary | ICD-10-CM | POA: Diagnosis not present

## 2022-09-09 DIAGNOSIS — Z1321 Encounter for screening for nutritional disorder: Secondary | ICD-10-CM | POA: Diagnosis not present

## 2022-09-15 DIAGNOSIS — E785 Hyperlipidemia, unspecified: Secondary | ICD-10-CM | POA: Diagnosis not present

## 2022-09-15 DIAGNOSIS — Z6830 Body mass index (BMI) 30.0-30.9, adult: Secondary | ICD-10-CM | POA: Diagnosis not present

## 2022-09-15 DIAGNOSIS — Z136 Encounter for screening for cardiovascular disorders: Secondary | ICD-10-CM | POA: Diagnosis not present

## 2022-09-15 DIAGNOSIS — Z1339 Encounter for screening examination for other mental health and behavioral disorders: Secondary | ICD-10-CM | POA: Diagnosis not present

## 2022-09-15 DIAGNOSIS — Z1331 Encounter for screening for depression: Secondary | ICD-10-CM | POA: Diagnosis not present

## 2022-09-15 DIAGNOSIS — Z139 Encounter for screening, unspecified: Secondary | ICD-10-CM | POA: Diagnosis not present

## 2022-09-15 DIAGNOSIS — I10 Essential (primary) hypertension: Secondary | ICD-10-CM | POA: Diagnosis not present

## 2022-09-15 DIAGNOSIS — Z0000 Encounter for general adult medical examination without abnormal findings: Secondary | ICD-10-CM | POA: Diagnosis not present

## 2022-09-30 DIAGNOSIS — H4052X1 Glaucoma secondary to other eye disorders, left eye, mild stage: Secondary | ICD-10-CM | POA: Diagnosis not present

## 2022-09-30 DIAGNOSIS — H209 Unspecified iridocyclitis: Secondary | ICD-10-CM | POA: Diagnosis not present

## 2022-09-30 DIAGNOSIS — H33002 Unspecified retinal detachment with retinal break, left eye: Secondary | ICD-10-CM | POA: Diagnosis not present

## 2022-10-05 DIAGNOSIS — Z139 Encounter for screening, unspecified: Secondary | ICD-10-CM | POA: Diagnosis not present

## 2022-10-05 DIAGNOSIS — R4181 Age-related cognitive decline: Secondary | ICD-10-CM | POA: Diagnosis not present

## 2022-10-05 DIAGNOSIS — Z6830 Body mass index (BMI) 30.0-30.9, adult: Secondary | ICD-10-CM | POA: Diagnosis not present

## 2022-10-05 DIAGNOSIS — I10 Essential (primary) hypertension: Secondary | ICD-10-CM | POA: Diagnosis not present

## 2023-01-29 DIAGNOSIS — H209 Unspecified iridocyclitis: Secondary | ICD-10-CM | POA: Diagnosis not present

## 2023-01-29 DIAGNOSIS — H4052X1 Glaucoma secondary to other eye disorders, left eye, mild stage: Secondary | ICD-10-CM | POA: Diagnosis not present

## 2023-02-26 DIAGNOSIS — H33002 Unspecified retinal detachment with retinal break, left eye: Secondary | ICD-10-CM | POA: Diagnosis not present

## 2023-02-26 DIAGNOSIS — H209 Unspecified iridocyclitis: Secondary | ICD-10-CM | POA: Diagnosis not present

## 2023-02-26 DIAGNOSIS — H4052X1 Glaucoma secondary to other eye disorders, left eye, mild stage: Secondary | ICD-10-CM | POA: Diagnosis not present

## 2023-03-11 DIAGNOSIS — E785 Hyperlipidemia, unspecified: Secondary | ICD-10-CM | POA: Diagnosis not present

## 2023-03-24 DIAGNOSIS — H409 Unspecified glaucoma: Secondary | ICD-10-CM | POA: Diagnosis not present

## 2023-03-24 DIAGNOSIS — E785 Hyperlipidemia, unspecified: Secondary | ICD-10-CM | POA: Diagnosis not present

## 2023-03-24 DIAGNOSIS — Z6830 Body mass index (BMI) 30.0-30.9, adult: Secondary | ICD-10-CM | POA: Diagnosis not present

## 2023-03-24 DIAGNOSIS — L989 Disorder of the skin and subcutaneous tissue, unspecified: Secondary | ICD-10-CM | POA: Diagnosis not present

## 2023-03-24 DIAGNOSIS — I10 Essential (primary) hypertension: Secondary | ICD-10-CM | POA: Diagnosis not present

## 2023-04-16 DIAGNOSIS — H4052X1 Glaucoma secondary to other eye disorders, left eye, mild stage: Secondary | ICD-10-CM | POA: Diagnosis not present

## 2023-04-16 DIAGNOSIS — H33002 Unspecified retinal detachment with retinal break, left eye: Secondary | ICD-10-CM | POA: Diagnosis not present

## 2023-04-16 DIAGNOSIS — H209 Unspecified iridocyclitis: Secondary | ICD-10-CM | POA: Diagnosis not present

## 2023-04-29 DIAGNOSIS — Z1231 Encounter for screening mammogram for malignant neoplasm of breast: Secondary | ICD-10-CM | POA: Diagnosis not present

## 2023-04-29 DIAGNOSIS — Z2821 Immunization not carried out because of patient refusal: Secondary | ICD-10-CM | POA: Diagnosis not present

## 2023-04-29 DIAGNOSIS — E663 Overweight: Secondary | ICD-10-CM | POA: Diagnosis not present

## 2023-04-29 DIAGNOSIS — Z6829 Body mass index (BMI) 29.0-29.9, adult: Secondary | ICD-10-CM | POA: Diagnosis not present

## 2023-04-29 DIAGNOSIS — Z0000 Encounter for general adult medical examination without abnormal findings: Secondary | ICD-10-CM | POA: Diagnosis not present

## 2023-05-21 DIAGNOSIS — Z1231 Encounter for screening mammogram for malignant neoplasm of breast: Secondary | ICD-10-CM | POA: Diagnosis not present

## 2023-05-31 DIAGNOSIS — L821 Other seborrheic keratosis: Secondary | ICD-10-CM | POA: Diagnosis not present

## 2023-05-31 DIAGNOSIS — L570 Actinic keratosis: Secondary | ICD-10-CM | POA: Diagnosis not present

## 2023-05-31 DIAGNOSIS — D235 Other benign neoplasm of skin of trunk: Secondary | ICD-10-CM | POA: Diagnosis not present

## 2023-08-12 DIAGNOSIS — H33002 Unspecified retinal detachment with retinal break, left eye: Secondary | ICD-10-CM | POA: Diagnosis not present

## 2023-08-18 DIAGNOSIS — H209 Unspecified iridocyclitis: Secondary | ICD-10-CM | POA: Diagnosis not present

## 2023-08-18 DIAGNOSIS — H33022 Retinal detachment with multiple breaks, left eye: Secondary | ICD-10-CM | POA: Diagnosis not present

## 2023-08-18 DIAGNOSIS — H4052X1 Glaucoma secondary to other eye disorders, left eye, mild stage: Secondary | ICD-10-CM | POA: Diagnosis not present

## 2023-12-22 DIAGNOSIS — H4052X1 Glaucoma secondary to other eye disorders, left eye, mild stage: Secondary | ICD-10-CM | POA: Diagnosis not present

## 2024-01-07 DIAGNOSIS — H4052X0 Glaucoma secondary to other eye disorders, left eye, stage unspecified: Secondary | ICD-10-CM | POA: Diagnosis not present

## 2024-02-23 DIAGNOSIS — R350 Frequency of micturition: Secondary | ICD-10-CM | POA: Diagnosis not present

## 2024-02-23 DIAGNOSIS — N390 Urinary tract infection, site not specified: Secondary | ICD-10-CM | POA: Diagnosis not present

## 2024-02-24 DIAGNOSIS — N390 Urinary tract infection, site not specified: Secondary | ICD-10-CM | POA: Diagnosis not present

## 2024-03-14 DIAGNOSIS — E785 Hyperlipidemia, unspecified: Secondary | ICD-10-CM | POA: Diagnosis not present

## 2024-03-28 DIAGNOSIS — E876 Hypokalemia: Secondary | ICD-10-CM | POA: Diagnosis not present

## 2024-03-28 DIAGNOSIS — Z1231 Encounter for screening mammogram for malignant neoplasm of breast: Secondary | ICD-10-CM | POA: Diagnosis not present

## 2024-03-28 DIAGNOSIS — I10 Essential (primary) hypertension: Secondary | ICD-10-CM | POA: Diagnosis not present

## 2024-03-28 DIAGNOSIS — E785 Hyperlipidemia, unspecified: Secondary | ICD-10-CM | POA: Diagnosis not present

## 2024-04-11 DIAGNOSIS — E876 Hypokalemia: Secondary | ICD-10-CM | POA: Diagnosis not present

## 2024-05-03 DIAGNOSIS — H4052X2 Glaucoma secondary to other eye disorders, left eye, moderate stage: Secondary | ICD-10-CM | POA: Diagnosis not present

## 2024-06-02 DIAGNOSIS — Z1231 Encounter for screening mammogram for malignant neoplasm of breast: Secondary | ICD-10-CM | POA: Diagnosis not present

## 2024-06-05 DIAGNOSIS — Z1231 Encounter for screening mammogram for malignant neoplasm of breast: Secondary | ICD-10-CM | POA: Diagnosis not present

## 2024-06-06 DIAGNOSIS — Z13820 Encounter for screening for osteoporosis: Secondary | ICD-10-CM | POA: Diagnosis not present

## 2024-06-06 DIAGNOSIS — M8588 Other specified disorders of bone density and structure, other site: Secondary | ICD-10-CM | POA: Diagnosis not present
